# Patient Record
Sex: Male | Born: 1995 | Race: White | Hispanic: No | Marital: Single | Smoking: Never smoker
Health system: Southern US, Community
[De-identification: ages and names within clinical notes are randomized; demographics above are authoritative.]

## PROBLEM LIST (undated history)

## (undated) DIAGNOSIS — Z9109 Other allergy status, other than to drugs and biological substances: Secondary | ICD-10-CM

## (undated) DIAGNOSIS — G43D0 Abdominal migraine, not intractable: Secondary | ICD-10-CM

## (undated) DIAGNOSIS — J45909 Unspecified asthma, uncomplicated: Secondary | ICD-10-CM

## (undated) DIAGNOSIS — R51 Headache: Secondary | ICD-10-CM

## (undated) HISTORY — PX: TYMPANOSTOMY TUBE PLACEMENT: SHX32

## (undated) HISTORY — DX: Headache: R51

## (undated) HISTORY — PX: CIRCUMCISION: SHX1350

## (undated) HISTORY — PX: OTHER SURGICAL HISTORY: SHX169

---
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
## 2014-01-24 ENCOUNTER — Encounter: Payer: Self-pay | Admitting: Internal Medicine

## 2014-01-24 ENCOUNTER — Ambulatory Visit (INDEPENDENT_AMBULATORY_CARE_PROVIDER_SITE_OTHER): Payer: BC Managed Care – PPO | Admitting: Internal Medicine

## 2014-01-24 VITALS — BP 122/80 | HR 72 | Temp 98.5°F | Ht 74.0 in | Wt 184.0 lb

## 2014-01-24 DIAGNOSIS — G43009 Migraine without aura, not intractable, without status migrainosus: Secondary | ICD-10-CM

## 2014-01-24 MED ORDER — AMPHETAMINE-DEXTROAMPHET ER 30 MG PO CP24: 30.0000 mg | ORAL_CAPSULE | Freq: Every day | ORAL | Status: DC

## 2014-01-24 NOTE — Progress Notes (Signed)
Pre visit review using our clinic review tool, if applicable. No additional management support is needed unless otherwise documented below in the visit note. 

## 2014-01-24 NOTE — Progress Notes (Signed)
Subjective:    Patient ID: Tyler Dorsey, male    DOB: 1996/01/08, 18 y.o.   MRN: [id number]  HPI 18 year old patient who is seen today to establish with our practice.  He has a history of migraine headaches and has been followed by neurology in the past.  More recently these have done well without prophylactic therapy.  He also has a history of a severe food allergy and keeps a EpiPen handy.  There is a remote history of asthma that has been stable and has improved with age.  He has some mild exercise related symptoms only. He also has a history of ADHD and has done well on Adderall XR 30 mg daily.  He has completed his first year at [employer]  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  He has done quite well on this regimen.  Past Medical History  Diagnosis Date  . Asthma   . Abdominal migraine   . Environmental allergies   . Headache(784.0)     History   Social History  . Marital Status: Single    Spouse Name: N/A    Number of Children: N/A  . Years of Education: N/A   Occupational History  . Not on file.   Social History Main Topics  . Smoking status: Never Smoker   . Smokeless tobacco: Never Used  . Alcohol Use: No  . Drug Use: No  . Sexual Activity: Not on file   Other Topics Concern  . Not on file   Social History Narrative  . No narrative on file    Past Surgical History  Procedure Laterality Date  . Tympanostomy tube placement Bilateral   . Other surgical history      Endoscopy  . Other surgical history      Widom Teeth Extracted  . Circumcision Bilateral     Family History  Problem Relation Age of Onset  . Migraines Mother   . Migraines Father   . Heart disease Father   . Heart attack Maternal Grandfather     Allergies  Allergen Reactions  . Amoxicillin Anaphylaxis  . Ampicillin Anaphylaxis  . Other Anaphylaxis    All nut, watermelon,canaloupe,all green food.  Marland Kitchen. Peach [Prunus Persica] Anaphylaxis  . Penicillins Anaphylaxis and Rash    Current  Outpatient Prescriptions on File Prior to Visit  Medication Sig Dispense Refill  . EPINEPHrine (EPIPEN) 0.3 mg/0.3 mL SOAJ Inject 0.3 mLs (0.3 mg total) into the muscle once.  1 Device  2  . SUMAVEL DOSEPRO 6 MG/0.5ML SOTJ Take at the onset of migraine may repeat in 4 hours of migraine recurs or persists.  0.5 mL  5   No current facility-administered medications on file prior to visit.    BP 122/80  Pulse 72  Temp(Src) 98.5 F (36.9 C) (Oral)  Ht 6\' 2"  (1.88 m)  Wt 184 lb (83.462 kg)  BMI 23.61 kg/m2      Review of Systems  Constitutional: Negative for fever, chills, appetite change and fatigue.  HENT: Negative for congestion, dental problem, ear pain, hearing loss, sore throat, tinnitus, trouble swallowing and voice change.   Eyes: Negative for pain, discharge and visual disturbance.  Respiratory: Negative for cough, chest tightness, wheezing and stridor.   Cardiovascular: Negative for chest pain, palpitations and leg swelling.  Gastrointestinal: Negative for nausea, vomiting, abdominal pain, diarrhea, constipation, blood in stool and abdominal distention.  Genitourinary: Negative for urgency, hematuria, flank pain, discharge, difficulty urinating and genital sores.  Musculoskeletal: Negative for  arthralgias, back pain, gait problem, joint swelling, myalgias and neck stiffness.  Skin: Negative for rash.  Neurological: Negative for dizziness, syncope, speech difficulty, weakness, numbness and headaches.  Hematological: Negative for adenopathy. Does not bruise/bleed easily.  Psychiatric/Behavioral: Negative for behavioral problems and dysphoric mood. The patient is not nervous/anxious.   All other systems reviewed and are negative.      Objective:   Physical Exam  Constitutional: He appears well-developed and well-nourished.  HENT:  Head: Normocephalic and atraumatic.  Right Ear: External ear normal.  Left Ear: External ear normal.  Nose: Nose normal.  Mouth/Throat:  Oropharynx is clear and moist.  Eyes: Conjunctivae and EOM are normal. Pupils are equal, round, and reactive to light. No scleral icterus.  Neck: Normal range of motion. Neck supple. No JVD present. No thyromegaly present.  Cardiovascular: Regular rhythm, normal heart sounds and intact distal pulses.  Exam reveals no gallop and no friction rub.   No murmur heard. Pulmonary/Chest: Effort normal and breath sounds normal. He exhibits no tenderness.  Abdominal: Soft. Bowel sounds are normal. He exhibits no distension and no mass. There is no tenderness.  Genitourinary: Prostate normal and penis normal.  Musculoskeletal: Normal range of motion. He exhibits no edema and no tenderness.  Lymphadenopathy:    He has no cervical adenopathy.  Neurological: He is alert. He has normal reflexes. No cranial nerve deficit. Coordination normal.  Skin: Skin is warm and dry. No rash noted.  Mild facial acne with some pustules  Psychiatric: He has a normal mood and affect. His behavior is normal.          Assessment & Plan:  Preventive health examination Migraine headaches.  Fairly stable without preventive medicines Severe food allergies.  Will maintain a EpiPen ADHD.  Controlled.  Adderall refilled  Return in one year or as needed

## 2014-01-24 NOTE — Patient Instructions (Signed)

## 2014-04-23 ENCOUNTER — Telehealth: Payer: Self-pay | Admitting: Internal Medicine

## 2014-04-23 NOTE — Telephone Encounter (Signed)
Pt request refill amphetamine-dextroamphetamine (ADDERALL XR) 30 MG 24 hr capsule °3 mo supply °

## 2014-04-24 MED ORDER — AMPHETAMINE-DEXTROAMPHET ER 30 MG PO CP24: 30.0000 mg | ORAL_CAPSULE | Freq: Every day | ORAL | Status: DC

## 2014-04-24 NOTE — Telephone Encounter (Signed)
Left detailed message Rx's ready for pickup. Rx printed and signed.

## 2015-05-18 ENCOUNTER — Other Ambulatory Visit: Payer: Self-pay | Admitting: Internal Medicine

## 2015-05-27 ENCOUNTER — Encounter: Payer: Self-pay | Admitting: Family Medicine

## 2015-05-27 ENCOUNTER — Ambulatory Visit (INDEPENDENT_AMBULATORY_CARE_PROVIDER_SITE_OTHER): Payer: BLUE CROSS/BLUE SHIELD | Admitting: Family Medicine

## 2015-05-27 VITALS — BP 120/80 | HR 73 | Temp 98.2°F | Resp 16 | Ht 74.0 in | Wt 182.4 lb

## 2015-05-27 DIAGNOSIS — F988 Other specified behavioral and emotional disorders with onset usually occurring in childhood and adolescence: Secondary | ICD-10-CM

## 2015-05-27 DIAGNOSIS — Z23 Encounter for immunization: Secondary | ICD-10-CM | POA: Diagnosis not present

## 2015-05-27 DIAGNOSIS — F909 Attention-deficit hyperactivity disorder, unspecified type: Secondary | ICD-10-CM

## 2015-05-27 MED ORDER — AMPHETAMINE-DEXTROAMPHET ER 30 MG PO CP24: 30.0000 mg | ORAL_CAPSULE | Freq: Every day | ORAL | Status: DC

## 2015-05-27 NOTE — Progress Notes (Signed)
Pre visit review using our clinic review tool, if applicable. No additional management support is needed unless otherwise documented below in the visit note. 

## 2015-05-27 NOTE — Progress Notes (Signed)
   Subjective:    Patient ID: Tyler Dorsey, male    DOB: 18-Mar-1996, 19 y.o.   MRN: [id number]  HPI Patient seen for follow-up of attention deficit disorder in absence of his primary physician. He's not been seen in over year. 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 He does not take Adderall regularly. He has occasional insomnia and appetite suppression he takes is a regular basis. Generally very healthy. Takes no regular medications. Doing well in school. Should graduate next December.  Past Medical History  Diagnosis Date  . Asthma   . Abdominal migraine   . Environmental allergies   . WGNFAOZH(086.5Headache(784.0)    Past Surgical History  Procedure Laterality Date  . Tympanostomy tube placement Bilateral   . Other surgical history      Endoscopy  . Other surgical history      Widom Teeth Extracted  . Circumcision Bilateral     reports that he has never smoked. He has never used smokeless tobacco. He reports that he does not drink alcohol or use illicit drugs. family history includes Heart attack in his maternal grandfather; Heart disease in his father; Migraines in his father and mother. Allergies  Allergen Reactions  . Amoxicillin Anaphylaxis  . Ampicillin Anaphylaxis  . Other Anaphylaxis    All nut, watermelon,canaloupe,all green food.  Marland Kitchen. Peach [Prunus Persica] Anaphylaxis  . Penicillins Anaphylaxis and Rash      Review of Systems  Constitutional: Negative for appetite change and unexpected weight change.  Respiratory: Negative for shortness of breath.   Cardiovascular: Negative for chest pain.  Neurological: Negative for headaches.       Objective:   Physical Exam  Constitutional: He appears well-developed and well-nourished.  Neck: Neck supple. No thyromegaly present.  Cardiovascular: Normal rate and regular rhythm.  Exam reveals no gallop.   No murmur heard. Pulmonary/Chest: Effort normal and breath sounds normal. No respiratory distress. He has no wheezes. He has  no rales.  Psychiatric: He has a normal mood and affect. His behavior is normal.          Assessment & Plan:  ADD. Stable. Refill Adderall XR 30 mg daily for 3 months. Flu vaccine given. Follow-up in one year with primary and sooner as needed

## 2015-11-09 ENCOUNTER — Ambulatory Visit (INDEPENDENT_AMBULATORY_CARE_PROVIDER_SITE_OTHER): Payer: BLUE CROSS/BLUE SHIELD | Admitting: Internal Medicine

## 2015-11-09 ENCOUNTER — Encounter: Payer: Self-pay | Admitting: Internal Medicine

## 2015-11-09 VITALS — BP 116/60 | HR 63 | Temp 98.1°F | Resp 16 | Ht 74.5 in | Wt 178.0 lb

## 2015-11-09 DIAGNOSIS — F909 Attention-deficit hyperactivity disorder, unspecified type: Secondary | ICD-10-CM

## 2015-11-09 DIAGNOSIS — F988 Other specified behavioral and emotional disorders with onset usually occurring in childhood and adolescence: Secondary | ICD-10-CM

## 2015-11-09 MED ORDER — METHYLPHENIDATE HCL ER (OSM) 18 MG PO TBCR: 18.0000 mg | EXTENDED_RELEASE_TABLET | Freq: Every day | ORAL | Status: DC

## 2015-11-09 MED ORDER — METHYLPHENIDATE HCL ER (OSM) 18 MG PO TBCR: 18.0000 mg | EXTENDED_RELEASE_TABLET | Freq: Every day | ORAL | Status: AC

## 2015-11-09 NOTE — Progress Notes (Signed)
Pre visit review using our clinic review tool, if applicable. No additional management support is needed unless otherwise documented below in the visit note. 

## 2015-11-09 NOTE — Patient Instructions (Addendum)
Call or return to clinic prn if these symptoms worsen or fail to improve as anticipated.   please use my chart to keep me informed of your progress

## 2015-11-09 NOTE — Progress Notes (Signed)
Subjective:    Patient ID: Tyler Dorsey, male    DOB: 19-Jan-1996, 20 y.o.   MRN: [id number]  HPI  Wt Readings from Last 3 Encounters:  11/09/15 178 lb (80.74 kg)  05/27/15 182 lb 6.4 oz (82.736 kg)  01/24/14 184 lb (83.462 kg) (85 %*, Z = 1.05)   * Growth percentiles are based on CDC 2-20 Years data.    20 year old patient who is seen infrequently goes long history of ADHD.  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  He has just completed his second year.   He states his ADD has been very well controlled, but he has been intolerant of Adderall.  He states the symptoms are chronic, but he describes mood swings, anxiety and insomnia.    He is asking about alternative medications  Past Medical History  Diagnosis Date  . Asthma   . Abdominal migraine   . Environmental allergies   . Headache(784.0)      Social History   Social History  . Marital Status: Single    Spouse Name: N/A  . Number of Children: N/A  . Years of Education: N/A   Occupational History  . Not on file.   Social History Main Topics  . Smoking status: Never Smoker   . Smokeless tobacco: Never Used  . Alcohol Use: No  . Drug Use: No  . Sexual Activity: Not on file   Other Topics Concern  . Not on file   Social History Narrative    Past Surgical History  Procedure Laterality Date  . Tympanostomy tube placement Bilateral   . Other surgical history      Endoscopy  . Other surgical history      Widom Teeth Extracted  . Circumcision Bilateral     Family History  Problem Relation Age of Onset  . Migraines Mother   . Migraines Father   . Heart disease Father   . Heart attack Maternal Grandfather     Allergies  Allergen Reactions  . Amoxicillin Anaphylaxis  . Ampicillin Anaphylaxis  . Other Anaphylaxis    All nut, watermelon,canaloupe,all green food.  Marland Kitchen Peach [Prunus Persica] Anaphylaxis  . Penicillins Anaphylaxis and Rash    Current Outpatient Prescriptions on File  Prior to Visit  Medication Sig Dispense Refill  . amphetamine-dextroamphetamine (ADDERALL XR) 30 MG 24 hr capsule Take 1 capsule (30 mg total) by mouth daily. 30 capsule 0  . EPINEPHrine (EPIPEN) 0.3 mg/0.3 mL SOAJ Inject 0.3 mLs (0.3 mg total) into the muscle once. 1 Device 2  . amphetamine-dextroamphetamine (ADDERALL XR) 30 MG 24 hr capsule Take 1 capsule (30 mg total) by mouth daily. (Patient not taking: Reported on 11/09/2015) 30 capsule 0  . amphetamine-dextroamphetamine (ADDERALL XR) 30 MG 24 hr capsule Take 1 capsule (30 mg total) by mouth daily. (Patient not taking: Reported on 11/09/2015) 30 capsule 0   No current facility-administered medications on file prior to visit.    BP 116/60 mmHg  Pulse 63  Temp(Src) 98.1 F (36.7 C) (Oral)  Resp 16  Ht 6' 2.5" (1.892 m)  Wt 178 lb (80.74 kg)  BMI 22.56 kg/m2  SpO2 99%     Review of Systems  Constitutional: Negative for fever, chills, appetite change and fatigue.  HENT: Negative for congestion, dental problem, ear pain, hearing loss, sore throat, tinnitus, trouble swallowing and voice change.   Eyes: Negative for pain, discharge and visual disturbance.  Respiratory: Negative for cough, chest tightness, wheezing and  stridor.   Cardiovascular: Negative for chest pain, palpitations and leg swelling.  Gastrointestinal: Negative for nausea, vomiting, abdominal pain, diarrhea, constipation, blood in stool and abdominal distention.  Genitourinary: Negative for urgency, hematuria, flank pain, discharge, difficulty urinating and genital sores.  Musculoskeletal: Negative for myalgias, back pain, joint swelling, arthralgias, gait problem and neck stiffness.  Skin: Negative for rash.  Neurological: Negative for dizziness, syncope, speech difficulty, weakness, numbness and headaches.  Hematological: Negative for adenopathy. Does not bruise/bleed easily.  Psychiatric/Behavioral: Positive for behavioral problems and sleep disturbance. Negative for  dysphoric mood. The patient is nervous/anxious.        Objective:   Physical Exam  Constitutional: He appears well-developed and well-nourished. No distress.  Psychiatric: He has a normal mood and affect. His behavior is normal. Judgment and thought content normal.          Assessment & Plan:    ADHD  Adderall side effects.    Will give a trial of low-dose Concerta and titrate as necessary.  If this medication is not effective or also associated with stimulant type side effects.  Will refer

## 2016-04-11 ENCOUNTER — Telehealth: Payer: Self-pay | Admitting: Internal Medicine

## 2016-04-11 NOTE — Telephone Encounter (Signed)
Pt need new Rx for Adderall °

## 2016-04-18 MED ORDER — AMPHETAMINE-DEXTROAMPHET ER 30 MG PO CP24: 30.0000 mg | ORAL_CAPSULE | Freq: Every day | ORAL | 0 refills | Status: AC

## 2016-04-18 NOTE — Addendum Note (Signed)
Addended by: Jimmye NormanPHANOS, Elleigh Cassetta J on: 04/18/2016 04:11 PM   Modules accepted: Orders

## 2016-04-18 NOTE — Telephone Encounter (Signed)
Tried to contact pt unable to leave message mailbox is full. Need to know if pt wants Adderall or Vyvanse?

## 2016-04-18 NOTE — Telephone Encounter (Signed)
Okay to refill prior prescription for Adderall if patient prefers

## 2016-04-18 NOTE — Telephone Encounter (Signed)
Pt would like adderall.

## 2016-04-18 NOTE — Telephone Encounter (Signed)
Spoke to pt's father Aneta Minshillip told him Rx's ready for pickup. Aneta Minshillip verbalized understanding. Rx's printed and signed.

## 2016-04-18 NOTE — Telephone Encounter (Signed)
Please see message and advise which medication you want pt on?
